# Patient Record
Sex: Female | Born: 1985 | Race: White | Hispanic: No | Marital: Single | Smoking: Never smoker
Health system: Southern US, Community
[De-identification: ages and names within clinical notes are randomized; demographics above are authoritative.]

## PROBLEM LIST (undated history)

## (undated) DIAGNOSIS — C801 Malignant (primary) neoplasm, unspecified: Secondary | ICD-10-CM

## (undated) HISTORY — PX: WISDOM TOOTH EXTRACTION: SHX21

## (undated) HISTORY — PX: INTRAUTERINE DEVICE INSERTION: SHX323

## (undated) HISTORY — DX: Malignant (primary) neoplasm, unspecified: C80.1

---

## 2012-08-20 DIAGNOSIS — C801 Malignant (primary) neoplasm, unspecified: Secondary | ICD-10-CM

## 2012-08-20 HISTORY — PX: BASAL CELL CARCINOMA EXCISION: SHX1214

## 2012-08-20 HISTORY — DX: Malignant (primary) neoplasm, unspecified: C80.1

## 2013-01-30 ENCOUNTER — Encounter: Payer: Self-pay | Admitting: Gynecology

## 2013-02-02 ENCOUNTER — Ambulatory Visit (INDEPENDENT_AMBULATORY_CARE_PROVIDER_SITE_OTHER): Payer: 59 | Admitting: Gynecology

## 2013-02-02 ENCOUNTER — Encounter: Payer: Self-pay | Admitting: Gynecology

## 2013-02-02 VITALS — BP 100/62 | HR 80 | Resp 20 | Ht 69.75 in | Wt 141.0 lb

## 2013-02-02 DIAGNOSIS — N946 Dysmenorrhea, unspecified: Secondary | ICD-10-CM

## 2013-02-02 DIAGNOSIS — Z01419 Encounter for gynecological examination (general) (routine) without abnormal findings: Secondary | ICD-10-CM

## 2013-02-02 DIAGNOSIS — C4491 Basal cell carcinoma of skin, unspecified: Secondary | ICD-10-CM | POA: Insufficient documentation

## 2013-02-02 DIAGNOSIS — Z0000 Encounter for general adult medical examination without abnormal findings: Secondary | ICD-10-CM

## 2013-02-02 DIAGNOSIS — Z124 Encounter for screening for malignant neoplasm of cervix: Secondary | ICD-10-CM

## 2013-02-02 LAB — POCT URINALYSIS DIPSTICK
Bilirubin, UA: NEGATIVE
Ketones, UA: NEGATIVE
Leukocytes, UA: NEGATIVE
Protein, UA: NEGATIVE
Urobilinogen, UA: NEGATIVE

## 2013-02-02 LAB — HEMOGLOBIN, FINGERSTICK: Hemoglobin, fingerstick: 13.9 g/dL (ref 12.0–16.0)

## 2013-02-02 MED ORDER — TRAMADOL HCL ER 100 MG PO TB24: 100.0000 mg | ORAL_TABLET | Freq: Every day | ORAL | Status: DC

## 2013-02-02 NOTE — Progress Notes (Signed)
27 y.o.Single Caucasian female   G0P0000 here for annual exam. Pt is currently sexually active.  Pt reports every so often will have really bad cramping, with low back pain and rectal pressure, uses motrin, heat and warm bath.  Gets better after bleeding starts.     Patient's last menstrual period was 01/30/2013.          Sexually active: yes  The current method of family planning is Mirena IUD Last pap: 12/16/11 neg Abnormal pap 03/2008 Colpo/BX  neg Tobacco:no Alcohol: occ glass of wine Hgb 13.3   U/A neg BMD Normal   2010 Health Maintenance  Topic Date Due  . Pap Smear  11/15/2004  . Tetanus/tdap  11/15/2005  . Influenza Vaccine  10/20/2012    Family History  Problem Relation Age of Onset  . Skin cancer Mother   . Anxiety disorder Mother   . Depression Mother   . Skin cancer Father   . Breast cancer Maternal Grandmother   . Diabetes Paternal Grandmother     Type 2   . Diabetes Paternal Grandfather     Type 2     There are no active problems to display for this patient.   Past Medical History  Diagnosis Date  . Cancer 08/2012    basal cell ca    Past Surgical History  Procedure Laterality Date  . Wisdom tooth extraction    . Basal cell carcinoma excision  08/2012    face below nose     Allergies: Latex  Current Outpatient Prescriptions  Medication Sig Dispense Refill  . levonorgestrel (MIRENA) 20 MCG/24HR IUD 1 each by Intrauterine route once. Inserted 8/11 ; Out 11/2014      . Calcium-Phosphorus-Vitamin D (CALCIUM GUMMIES PO) Take by mouth.      . Multiple Vitamins-Minerals (MULTIVITAMIN PO) Take by mouth.       No current facility-administered medications for this visit.    ROS: Pertinent items are noted in HPI.  Exam:    BP 100/62  Pulse 80  Resp 20  Ht 5' 9.75" (1.772 m)  Wt 141 lb (63.957 kg)  BMI 20.37 kg/m2  LMP 01/30/2013 Weight change: @WEIGHTCHANGE @ Last 3 height recordings:  Ht Readings from Last 3 Encounters:  02/02/13 5' 9.75" (1.772 m)    General appearance: alert, cooperative and appears stated age Head: Normocephalic, without obvious abnormality, atraumatic Neck: no adenopathy, no carotid bruit, no JVD, supple, symmetrical, trachea midline and thyroid not enlarged, symmetric, no tenderness/mass/nodules Lungs: clear to auscultation bilaterally Breasts: normal appearance, no masses or tenderness Heart: regular rate and rhythm, S1, S2 normal, no murmur, click, rub or gallop Abdomen: soft, non-tender; bowel sounds normal; no masses,  no organomegaly Extremities: extremities normal, atraumatic, no cyanosis or edema Skin: Skin color, texture, turgor normal. No rashes or lesions Lymph nodes: Cervical, supraclavicular, and axillary nodes normal. no inguinal nodes palpated Neurologic: Grossly normal   Pelvic: External genitalia:  no lesions              Urethra: normal appearing urethra with no masses, tenderness or lesions              Bartholins and Skenes: normal                 Vagina: normal appearing vagina with normal color and discharge, no lesions              Cervix: normal appearance and IUD string visualized  Pap taken: yes        Bimanual Exam:  Uterus:  uterus is normal size, shape, consistency and nontender, retroverted                                      Adnexa:    normal adnexa in size, nontender and no masses                                      Rectovaginal: Confirms                                      Anus:  normal sphincter tone, no lesions  A: well woman Contraceptive management dysmenorrhea     P: pap smear with reflex Guidelines reviewed Will try ultram as needed for these severe months but will use motrin other months counseled on breast self exam, STD prevention, adequate intake of calcium and vitamin D, diet and exercise return annually or prn   An After Visit Summary was printed and given to the patient.

## 2013-02-02 NOTE — Patient Instructions (Signed)

## 2013-02-05 LAB — IPS PAP TEST WITH REFLEX TO HPV

## 2013-07-30 ENCOUNTER — Telehealth: Payer: Self-pay | Admitting: Gynecology

## 2013-07-30 NOTE — Telephone Encounter (Signed)
Patient thinks she has a yeast infection and declined an appointment. Patient says she is going out of town.

## 2013-07-30 NOTE — Telephone Encounter (Signed)
Left message to call Maria Bates at [phone]. 

## 2013-08-06 NOTE — Telephone Encounter (Signed)
Left message to call Gavinn Collard at [phone]. 

## 2013-08-09 NOTE — Telephone Encounter (Signed)
Dr. Charlies Constable, patient has not returned phone calls x3. Okay to close encounter?

## 2013-08-09 NOTE — Telephone Encounter (Signed)
Left message to call Rhyleigh Grassel at [phone]. 

## 2013-08-14 NOTE — Telephone Encounter (Signed)
yes

## 2013-09-28 ENCOUNTER — Ambulatory Visit
Admission: RE | Admit: 2013-09-28 | Discharge: 2013-09-28 | Disposition: A | Discharge status: Home / Self Care | Payer: 59 | Source: Ambulatory Visit | Attending: Family Medicine | Admitting: Family Medicine

## 2013-09-28 ENCOUNTER — Other Ambulatory Visit: Payer: Self-pay | Admitting: Family Medicine

## 2013-09-28 DIAGNOSIS — R7611 Nonspecific reaction to tuberculin skin test without active tuberculosis: Secondary | ICD-10-CM

## 2014-11-27 IMAGING — CR DG CHEST 1V
1 series · 1 of 1 positions shown · non-contrast
Comparison: None.

CLINICAL DATA: Positive PPD.  Asymptomatic patient.

EXAM:
CHEST - 1 VIEW

[view not recorded]
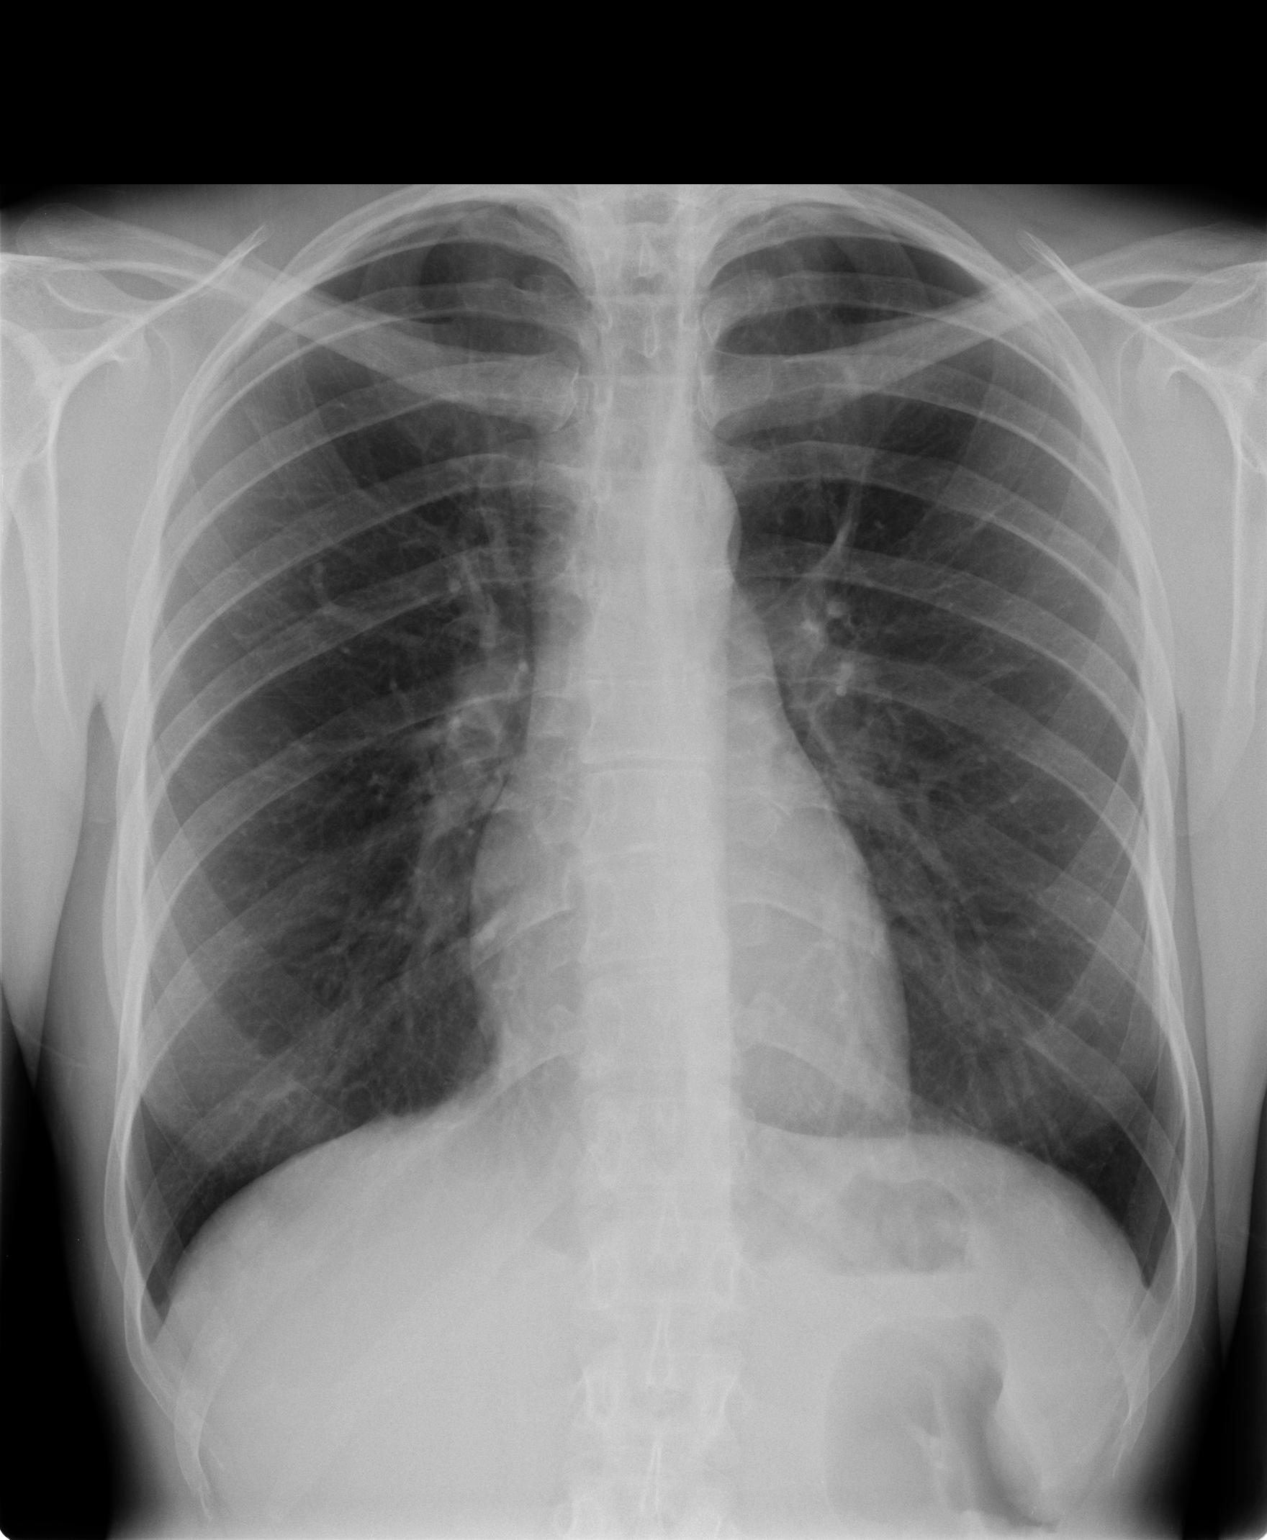

[1 of 1 positions shown; findings below may reference images not displayed]

FINDINGS: Cardiopericardial silhouette within normal limits. Mediastinal
contours normal. Trachea midline. No airspace disease or effusion.
IMPRESSION: No active cardiopulmonary disease. No evidence of pulmonary
tuberculosis.

## 2015-02-17 ENCOUNTER — Telehealth: Payer: Self-pay | Admitting: Obstetrics & Gynecology

## 2015-02-17 DIAGNOSIS — Z30433 Encounter for removal and reinsertion of intrauterine contraceptive device: Secondary | ICD-10-CM

## 2015-02-17 NOTE — Telephone Encounter (Signed)
Patient is a prior patient of Dr. Brion Aliment and is due for an AEX and IUD removal/insertion. 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 She declined to schedule until speaking with the nurse. Insurance on file updated.

## 2015-02-17 NOTE — Telephone Encounter (Signed)
Please have her use condoms for 2 weeks prior to mirena removal/reinsertion and we will check a UPT. If you make her annual a few weeks after her insertion, this can also be her IUD check.

## 2015-02-17 NOTE — Telephone Encounter (Signed)
Spoke with patient. Patient states that she will be home from school 03/10/2015. Needs to schedule her aex and iud removal/reinsertion. Advised these will need to be performed in separate appointments. Patient's Mirena was placed 10/2009. Is having monthly cycle that is light. LMP was 02/03/2015. Patient is asking "Which doctor places the most IUD's per month? Who has the most experience?" Advised I can not provide a specific numbers regarding IUD placement, but I ensure her that all our MD's are qualified to remove and replace IUD's. Offered to schedule aex appointment for patient to meet new provider prior to IUD removal, but patient prefers I speak with the provider first regarding scheduling.Patient is requesting to have IUD removed/replaced on 12/22 due to fiance's work schedule. Advised I will need to speak with the provider regarding scheduling of IUD removal and replacement as IUD has expired. Patient is agreeable. Asking if she can have her AEX in the same day but a different appointment. Advised will need to be in a separate day for insurance purposes. Advised will speak with the provider and return call.

## 2015-02-17 NOTE — Telephone Encounter (Signed)
Left message to call Cecelia Graciano at [phone]. 

## 2015-02-19 MED ORDER — MISOPROSTOL 200 MCG PO TABS: ORAL_TABLET | ORAL | Status: DC

## 2015-02-19 NOTE — Telephone Encounter (Signed)
Spoke with patient. Advised of message as seen below from [place]. Patient is agreeable. Appointment for IUD removal/replacement scheduled for 03/13/2015 at 3:30 pm with Dr.Jertson. Pre procedure instructions given.  Motrin instructions given. Motrin=Advil=Ibuprofen, 800 mg one hour before appointment. Eat a meal and hydrate well before appointment. Cytotec instructions given. Place Cytotec 2 tablets vaginally 6-12 hours prior to her appointment. Order for Cytotec 200 mcg #2 0RF sent to pharmacy on file. AEX with IUD recheck scheduled for 03/27/2015 at 11 am with Dr.Jertson. Agreeable to date and time.  Routing to provider for final review. Patient agreeable to disposition. Will close encounter.

## 2015-03-13 ENCOUNTER — Encounter: Payer: Self-pay | Admitting: Obstetrics and Gynecology

## 2015-03-13 ENCOUNTER — Ambulatory Visit (INDEPENDENT_AMBULATORY_CARE_PROVIDER_SITE_OTHER): Payer: BLUE CROSS/BLUE SHIELD | Admitting: Obstetrics and Gynecology

## 2015-03-13 VITALS — BP 110/70 | HR 64 | Resp 16 | Wt 136.0 lb

## 2015-03-13 DIAGNOSIS — Z113 Encounter for screening for infections with a predominantly sexual mode of transmission: Secondary | ICD-10-CM

## 2015-03-13 DIAGNOSIS — Z30433 Encounter for removal and reinsertion of intrauterine contraceptive device: Secondary | ICD-10-CM | POA: Diagnosis not present

## 2015-03-13 DIAGNOSIS — Z01812 Encounter for preprocedural laboratory examination: Secondary | ICD-10-CM

## 2015-03-13 LAB — POCT URINE PREGNANCY: Preg Test, Ur: NEGATIVE

## 2015-03-13 MED ORDER — NAPROXEN SODIUM 550 MG PO TABS: ORAL_TABLET | ORAL | Status: AC

## 2015-03-13 MED ORDER — TRAMADOL HCL 50 MG PO TABS: 50.0000 mg | ORAL_TABLET | Freq: Four times a day (QID) | ORAL | Status: DC | PRN

## 2015-03-13 NOTE — Patient Instructions (Signed)

## 2015-03-13 NOTE — Progress Notes (Signed)
Patient ID: Maria Bates, female   DOB: [birth year], 29 y.o.   MRN: [id number] GYNECOLOGY  VISIT   HPI: 29 y.o.   Single  Caucasian  female   G0P0000 with Patient's last menstrual period was 02/26/2015.   here to have her Mirena IUD removed and a new one inserted. She has loved her IUD. She has light cycles with the mirena. After her last mirena she had intense cramping for 3 weeks.   GYNECOLOGIC HISTORY: Patient's last menstrual period was 02/26/2015. Contraception:IUD Menopausal hormone therapy: None        OB History    Gravida Para Term Preterm AB TAB SAB Ectopic Multiple Living   0 0 0 0 0 0 0 0 0 0          Patient Active Problem List   Diagnosis Date Noted  . Basal cell adenocarcinoma 02/02/2013  . Dysmenorrhea 02/02/2013    Past Medical History  Diagnosis Date  . Cancer ([place]) 08/2012    basal cell ca    Past Surgical History  Procedure Laterality Date  . Wisdom tooth extraction    . Basal cell carcinoma excision  08/2012    face below nose   . Intrauterine device insertion      Current Outpatient Prescriptions  Medication Sig Dispense Refill  . Calcium-Phosphorus-Vitamin D (CALCIUM GUMMIES PO) Take by mouth.    . levonorgestrel (MIRENA) 20 MCG/24HR IUD 1 each by Intrauterine route once. Inserted 8/11 ; Out 11/2014    . misoprostol (CYTOTEC) 200 MCG tablet Place 2 tablets pv 6-12 hours prior to procedure. 2 tablet 0  . Multiple Vitamins-Minerals (MULTIVITAMIN PO) Take by mouth.    . traMADol (ULTRAM ER) 100 MG 24 hr tablet Take 1 tablet (100 mg total) by mouth daily. prn 12 tablet 0   No current facility-administered medications for this visit.     ALLERGIES: Latex  Family History  Problem Relation Age of Onset  . Skin cancer Mother   . Anxiety disorder Mother   . Depression Mother   . Skin cancer Father   . Diabetes Father   . Breast cancer Maternal Grandmother   . Diabetes Paternal Grandmother     Type 2   . Diabetes Paternal Grandfather     Type 2      Social History   Social History  . Marital Status: Single    Spouse Name: N/A  . Number of Children: N/A  . Years of Education: N/A   Occupational History  . Not on file.   Social History Main Topics  . Smoking status: Never Smoker   . Smokeless tobacco: Never Used  . Alcohol Use: 0.5 - 1.0 oz/week    1-2 drink(s) per week     Comment: occ glass of wine  . Drug Use: No  . Sexual Activity:    Partners: Female    Museum/gallery curator: IUD     Comment: Mirena   Other Topics Concern  . Not on file   Social History Narrative    Review of Systems  Genitourinary:       Painful menstrual cramps   All other systems reviewed and are negative.   PHYSICAL EXAMINATION:    BP 110/70 mmHg  Pulse 64  Resp 16  Wt 136 lb (61.689 kg)  LMP 02/26/2015    General appearance: alert, cooperative and appears stated age   Pelvic: External genitalia:  no lesions  Urethra:  normal appearing urethra with no masses, tenderness or lesions              Bartholins and Skenes: normal                 Vagina: normal appearing vagina with normal color and discharge, no lesions              Cervix: no lesions , IUD string 4 cm              Bimanual Exam:  Uterus:  normal size, contour, position, consistency, mobility, non-tender and anteverted              Adnexa: no mass, fullness, tenderness                The risks of the mirena IUD were reviewed with the patient, including infection, abnormal bleeding and uterine perfortion. Consent was signed.  A speculum was placed in the vagina, the old IUD was removed with ringed forceps. The cervix was cleansed with betadine. A tenaculum was placed on the cervix, the uterus sounded to 7-8 cm.  The mirena IUD was inserted without difficulty. The string were cut to 3-4 cm. The tenaculum was removed. Slight oozing from the tenaculum site was stopped with pressure.   The patient tolerated the procedure well.   Chaperone was present  for exam.  ASSESSMENT IUD insertion, Mirena inserted Screening for STD   PLAN Anaprox for pain Limited tramadol for breakthrough pain F/U in 2 weeks for an annual exam and IUD check   An After Visit Summary was printed and given to the patient.

## 2015-03-14 ENCOUNTER — Telehealth: Payer: Self-pay

## 2015-03-14 LAB — GC/CHLAMYDIA PROBE AMP
CT Probe RNA: NOT DETECTED
GC Probe RNA: NOT DETECTED

## 2015-03-14 NOTE — Telephone Encounter (Signed)
Left detailed message at number provided [phone], okay per ROI. Advised of normal results and to return call with any further questions.  Routing to provider for final review. Patient agreeable to disposition. Will close encounter.

## 2015-03-14 NOTE — Telephone Encounter (Signed)
-----   Message from Salvadore Dom, MD sent at 03/14/2015 10:31 AM EST ----- Please advise the patient of normal results.

## 2015-03-27 ENCOUNTER — Ambulatory Visit (INDEPENDENT_AMBULATORY_CARE_PROVIDER_SITE_OTHER): Payer: BLUE CROSS/BLUE SHIELD | Admitting: Obstetrics and Gynecology

## 2015-03-27 ENCOUNTER — Encounter: Payer: Self-pay | Admitting: Obstetrics and Gynecology

## 2015-03-27 VITALS — BP 104/68 | HR 68 | Resp 16 | Ht 69.75 in | Wt 141.0 lb

## 2015-03-27 DIAGNOSIS — Z30431 Encounter for routine checking of intrauterine contraceptive device: Secondary | ICD-10-CM | POA: Diagnosis not present

## 2015-03-27 DIAGNOSIS — Z124 Encounter for screening for malignant neoplasm of cervix: Secondary | ICD-10-CM | POA: Diagnosis not present

## 2015-03-27 DIAGNOSIS — Z01419 Encounter for gynecological examination (general) (routine) without abnormal findings: Secondary | ICD-10-CM

## 2015-03-27 DIAGNOSIS — Z0000 Encounter for general adult medical examination without abnormal findings: Secondary | ICD-10-CM | POA: Diagnosis not present

## 2015-03-27 NOTE — Addendum Note (Signed)
Addended by: Susanne Greenhouse E on: 03/27/2015 02:55 PM   Modules accepted: Orders

## 2015-03-27 NOTE — Patient Instructions (Addendum)
EXERCISE AND DIET:  We recommended that you start or continue a regular exercise program for good health. Regular exercise means any activity that makes your heart beat faster and makes you sweat.  We recommend exercising at least 30 minutes per day at least 3 days a week, preferably 4 or 5.  We also recommend a diet low in fat and sugar.  Inactivity, poor dietary choices and obesity can cause diabetes, heart attack, stroke, and kidney damage, among others.    ALCOHOL AND SMOKING:  Women should limit their alcohol intake to no more than 7 drinks/beers/glasses of wine (combined, not each!) per week. Moderation of alcohol intake to this level decreases your risk of breast cancer and liver damage. And of course, no recreational drugs are part of a healthy lifestyle.  And absolutely no smoking or even second hand smoke. Most people know smoking can cause heart and lung diseases, but did you know it also contributes to weakening of your bones? Aging of your skin?  Yellowing of your teeth and nails?  CALCIUM AND VITAMIN D:  Adequate intake of calcium and Vitamin D are recommended.  The recommendations for exact amounts of these supplements seem to change often, but generally speaking 600 mg of calcium (either carbonate or citrate) and 800 units of Vitamin D per day seems prudent. Certain women may benefit from higher intake of Vitamin D.  If you are among these women, your doctor will have told you during your visit.    PAP SMEARS:  Pap smears, to check for cervical cancer or precancers,  have traditionally been done yearly, although recent scientific advances have shown that most women can have pap smears less often.  However, every woman still should have a physical exam from her gynecologist every year. It will include a breast check, inspection of the vulva and vagina to check for abnormal growths or skin changes, a visual exam of the cervix, and then an exam to evaluate the size and shape of the uterus and  ovaries.  And after 29 years of age, a rectal exam is indicated to check for rectal cancers. We will also provide age appropriate advice regarding health maintenance, like when you should have certain vaccines, screening for sexually transmitted diseases, bone density testing, colonoscopy, mammograms, etc.      

## 2015-03-27 NOTE — Progress Notes (Signed)
30 y.o. G0P0000 SingleCaucasianF here for annual exam and IUD recheck. She only cramped for a few hours. 2 episodes of shooting pain in her cervix, only lasted x 1 min. Currently feeling fine. She has had light cycles with the mirena (has had one for 5 years), thinks she is on her cycle now. No pain with intercourse. She is engaged, getting married in August.       Patient's last menstrual period was 02/26/2015.          Sexually active: Yes.    The current method of family planning is IUD.    Exercising: Yes.    walking Smoker:  no  Health Maintenance: Pap:  11-14-14WNL History of abnormal Pap:  Yes, colposcopy in 2009, f/u was normal.  TDaP:  2014 Gardasil: completed all 3    reports that she has never smoked. She has never used smokeless tobacco. She reports that she drinks about 0.5 - 1.0 oz of alcohol per week. She reports that she does not use illicit drugs.  Past Medical History  Diagnosis Date  . Cancer ([place]) 08/2012    basal cell ca    Past Surgical History  Procedure Laterality Date  . Wisdom tooth extraction    . Basal cell carcinoma excision  08/2012    face below nose   . Intrauterine device insertion      Current Outpatient Prescriptions  Medication Sig Dispense Refill  . Calcium-Phosphorus-Vitamin D (CALCIUM GUMMIES PO) Take by mouth.    . levonorgestrel (MIRENA) 20 MCG/24HR IUD 1 each by Intrauterine route once. Inserted 8/11 ; Out 11/2014    . Multiple Vitamins-Minerals (MULTIVITAMIN PO) Take by mouth.    . naproxen sodium (ANAPROX DS) 550 MG tablet 1 tab po q 12 hours prn pain 30 tablet 2  . traMADol (ULTRAM) 50 MG tablet Take 1 tablet (50 mg total) by mouth every 6 (six) hours as needed. 20 tablet 0   No current facility-administered medications for this visit.    Family History  Problem Relation Age of Onset  . Skin cancer Mother   . Anxiety disorder Mother   . Depression Mother   . Skin cancer Father   . Diabetes Father   . Breast cancer Maternal  Grandmother   . Diabetes Paternal Grandmother     Type 2   . Diabetes Paternal Grandfather     Type 2   Father with high cholesterol  Review of Systems  Constitutional: Negative.   HENT: Negative.   Eyes: Negative.   Respiratory: Negative.   Cardiovascular: Negative.   Gastrointestinal: Negative.   Endocrine: Negative.   Genitourinary: Negative.   Musculoskeletal: Negative.   Skin: Negative.   Allergic/Immunologic: Negative.   Neurological: Negative.   Psychiatric/Behavioral: Negative.     Exam:   BP 104/68 mmHg  Pulse 68  Resp 16  Ht 5' 9.75" (1.772 m)  Wt 141 lb (63.957 kg)  BMI 20.37 kg/m2  LMP 02/26/2015  Weight change: @WEIGHTCHANGE @ Height:   Height: 5' 9.75" (177.2 cm)  Ht Readings from Last 3 Encounters:  03/27/15 5' 9.75" (1.772 m)  02/02/13 5' 9.75" (1.772 m)    General appearance: alert, cooperative and appears stated age Head: Normocephalic, without obvious abnormality, atraumatic Neck: no adenopathy, supple, symmetrical, trachea midline and thyroid normal to inspection and palpation Lungs: clear to auscultation bilaterally Breasts: normal appearance, no masses or tenderness Heart: regular rate and rhythm Abdomen: soft, non-tender; bowel sounds normal; no masses,  no organomegaly Extremities: extremities normal,  atraumatic, no cyanosis or edema Skin: Skin color, texture, turgor normal. No rashes or lesions Lymph nodes: Cervical, supraclavicular, and axillary nodes normal. No abnormal inguinal nodes palpated Neurologic: Grossly normal   Pelvic: External genitalia:  no lesions              Urethra:  normal appearing urethra with no masses, tenderness or lesions              Bartholins and Skenes: normal                 Vagina: normal appearing vagina with normal color and discharge, no lesions              Cervix: no lesions, IUD strings 3-4 cm.               Bimanual Exam:  Uterus:  normal size, contour, position, consistency, mobility, non-tender               Adnexa: no mass, fullness, tenderness               Rectovaginal: Confirms               Anus:  normal sphincter tone, no lesions  Chaperone was present for exam.  A:  Well Woman with normal exam  IUD check, doing well  P:   Pap with reflex hpv  Consider lipids next year, she was in pain with attempted blood draw today  Discussed breast self exam  Information on vit d and calcium given

## 2015-03-31 LAB — IPS PAP TEST WITH REFLEX TO HPV
